# Patient Record
Sex: Male | Born: 1985 | Race: Black or African American | Hispanic: No | Marital: Single | Smoking: Current every day smoker
Health system: Southern US, Community
[De-identification: ages and names within clinical notes are randomized; demographics above are authoritative.]

## PROBLEM LIST (undated history)

## (undated) DIAGNOSIS — F1010 Alcohol abuse, uncomplicated: Secondary | ICD-10-CM

---

## 2008-08-27 ENCOUNTER — Emergency Department: Payer: Self-pay | Admitting: Emergency Medicine

## 2009-08-03 ENCOUNTER — Emergency Department: Payer: Self-pay | Admitting: Emergency Medicine

## 2010-07-17 ENCOUNTER — Emergency Department: Payer: Self-pay | Admitting: Emergency Medicine

## 2012-02-07 ENCOUNTER — Emergency Department: Payer: Self-pay | Admitting: *Deleted

## 2013-09-05 ENCOUNTER — Emergency Department: Payer: Self-pay | Admitting: Emergency Medicine

## 2013-09-05 LAB — COMPREHENSIVE METABOLIC PANEL
ANION GAP: 5 — AB (ref 7–16)
Albumin: 3.7 g/dL (ref 3.4–5.0)
Alkaline Phosphatase: 75 U/L
BILIRUBIN TOTAL: 0.2 mg/dL (ref 0.2–1.0)
BUN: 9 mg/dL (ref 7–18)
CHLORIDE: 107 mmol/L (ref 98–107)
Calcium, Total: 8.3 mg/dL — ABNORMAL LOW (ref 8.5–10.1)
Co2: 25 mmol/L (ref 21–32)
Creatinine: 0.98 mg/dL (ref 0.60–1.30)
Glucose: 92 mg/dL (ref 65–99)
Osmolality: 272 (ref 275–301)
POTASSIUM: 4 mmol/L (ref 3.5–5.1)
SGOT(AST): 28 U/L (ref 15–37)
SGPT (ALT): 40 U/L (ref 12–78)
Sodium: 137 mmol/L (ref 136–145)
Total Protein: 7.1 g/dL (ref 6.4–8.2)

## 2013-09-05 LAB — CBC
HCT: 42.9 % (ref 40.0–52.0)
HGB: 14.6 g/dL (ref 13.0–18.0)
MCH: 31.1 pg (ref 26.0–34.0)
MCHC: 34.1 g/dL (ref 32.0–36.0)
MCV: 91 fL (ref 80–100)
PLATELETS: 178 10*3/uL (ref 150–440)
RBC: 4.71 10*6/uL (ref 4.40–5.90)
RDW: 14 % (ref 11.5–14.5)
WBC: 6 10*3/uL (ref 3.8–10.6)

## 2013-09-05 LAB — ETHANOL
Ethanol %: 0.143 % — ABNORMAL HIGH (ref 0.000–0.080)
Ethanol: 143 mg/dL

## 2013-09-05 LAB — LIPASE, BLOOD: Lipase: 72 U/L — ABNORMAL LOW (ref 73–393)

## 2013-11-26 ENCOUNTER — Emergency Department: Payer: Self-pay | Admitting: Emergency Medicine

## 2013-11-26 LAB — URINALYSIS, COMPLETE
BILIRUBIN, UR: NEGATIVE
BLOOD: NEGATIVE
Bacteria: NONE SEEN
Glucose,UR: NEGATIVE mg/dL (ref 0–75)
Ketone: NEGATIVE
Leukocyte Esterase: NEGATIVE
Nitrite: NEGATIVE
PH: 6 (ref 4.5–8.0)
Protein: NEGATIVE
RBC,UR: 2 /HPF (ref 0–5)
SPECIFIC GRAVITY: 1.002 (ref 1.003–1.030)
Squamous Epithelial: NONE SEEN
WBC UR: <1 /HPF (ref 0–5)

## 2013-11-26 LAB — CBC WITH DIFFERENTIAL/PLATELET
BASOS ABS: 0.1 10*3/uL (ref 0.0–0.1)
Basophil %: 0.7 %
EOS ABS: 0.3 10*3/uL (ref 0.0–0.7)
EOS PCT: 3.6 %
HCT: 47.1 % (ref 40.0–52.0)
HGB: 15.9 g/dL (ref 13.0–18.0)
LYMPHS ABS: 2.4 10*3/uL (ref 1.0–3.6)
LYMPHS PCT: 30.1 %
MCH: 31.1 pg (ref 26.0–34.0)
MCHC: 33.8 g/dL (ref 32.0–36.0)
MCV: 92 fL (ref 80–100)
Monocyte #: 0.6 x10 3/mm (ref 0.2–1.0)
Monocyte %: 7 %
Neutrophil #: 4.7 10*3/uL (ref 1.4–6.5)
Neutrophil %: 58.6 %
PLATELETS: 254 10*3/uL (ref 150–440)
RBC: 5.1 10*6/uL (ref 4.40–5.90)
RDW: 13.8 % (ref 11.5–14.5)
WBC: 8.1 10*3/uL (ref 3.8–10.6)

## 2013-11-26 LAB — LIPASE, BLOOD: LIPASE: 69 U/L — AB (ref 73–393)

## 2013-11-26 LAB — COMPREHENSIVE METABOLIC PANEL
ALBUMIN: 4.5 g/dL (ref 3.4–5.0)
Alkaline Phosphatase: 73 U/L
Anion Gap: 5 — ABNORMAL LOW (ref 7–16)
BUN: 6 mg/dL — AB (ref 7–18)
Bilirubin,Total: 0.4 mg/dL (ref 0.2–1.0)
CREATININE: 1.1 mg/dL (ref 0.60–1.30)
Calcium, Total: 9.5 mg/dL (ref 8.5–10.1)
Chloride: 105 mmol/L (ref 98–107)
Co2: 29 mmol/L (ref 21–32)
GLUCOSE: 94 mg/dL (ref 65–99)
Osmolality: 275 (ref 275–301)
POTASSIUM: 3.6 mmol/L (ref 3.5–5.1)
SGOT(AST): 6 U/L — ABNORMAL LOW (ref 15–37)
SGPT (ALT): 30 U/L (ref 12–78)
SODIUM: 139 mmol/L (ref 136–145)
TOTAL PROTEIN: 8.2 g/dL (ref 6.4–8.2)

## 2013-12-31 ENCOUNTER — Emergency Department: Payer: Self-pay | Admitting: Emergency Medicine
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
## 2015-12-27 ENCOUNTER — Emergency Department
Admission: EM | Admit: 2015-12-27 | Discharge: 2015-12-28 | Disposition: A | Discharge status: Home / Self Care | Payer: Self-pay | Attending: Emergency Medicine | Admitting: Emergency Medicine

## 2015-12-27 DIAGNOSIS — F17210 Nicotine dependence, cigarettes, uncomplicated: Secondary | ICD-10-CM | POA: Insufficient documentation

## 2015-12-27 DIAGNOSIS — F10920 Alcohol use, unspecified with intoxication, uncomplicated: Secondary | ICD-10-CM

## 2015-12-27 DIAGNOSIS — F10129 Alcohol abuse with intoxication, unspecified: Secondary | ICD-10-CM | POA: Insufficient documentation

## 2015-12-27 MED ORDER — SODIUM CHLORIDE 0.9 % IV BOLUS (SEPSIS)
1000.0000 mL | Freq: Once | INTRAVENOUS | Status: AC
  Administered 2015-12-28: 1000 mL via INTRAVENOUS

## 2015-12-27 MED ORDER — ONDANSETRON HCL 4 MG/2ML IJ SOLN
4.0000 mg | Freq: Once | INTRAMUSCULAR | Status: AC
  Administered 2015-12-28: 4 mg via INTRAVENOUS
  Filled 2015-12-27: qty 2

## 2015-12-28 ENCOUNTER — Encounter: Payer: Self-pay | Admitting: Emergency Medicine

## 2015-12-28 LAB — URINE DRUG SCREEN, QUALITATIVE (ARMC ONLY)
Amphetamines, Ur Screen: NOT DETECTED
BARBITURATES, UR SCREEN: NOT DETECTED
BENZODIAZEPINE, UR SCRN: NOT DETECTED
Cannabinoid 50 Ng, Ur ~~LOC~~: NOT DETECTED
Cocaine Metabolite,Ur ~~LOC~~: NOT DETECTED
MDMA (Ecstasy)Ur Screen: NOT DETECTED
Methadone Scn, Ur: NOT DETECTED
OPIATE, UR SCREEN: NOT DETECTED
PHENCYCLIDINE (PCP) UR S: NOT DETECTED
Tricyclic, Ur Screen: NOT DETECTED

## 2015-12-28 LAB — ETHANOL: Alcohol, Ethyl (B): 349 mg/dL (ref <5)

## 2015-12-28 NOTE — ED Notes (Signed)
Pt found on hospital grounds laying on floor. Pt was initially dropped off by [place] police. Pt (+) etoh.

## 2015-12-28 NOTE — ED Notes (Signed)
Pt sleeping comfortably in stretcher with lights turned off. Respirations even and unlabored. Will continue to monitor.

## 2015-12-28 NOTE — Discharge Instructions (Signed)
Alcohol Intoxication  Alcohol intoxication occurs when the amount of alcohol that a person has consumed impairs his or her ability to mentally and physically function. Alcohol directly impairs the normal chemical activity of the brain. Drinking large amounts of alcohol can lead to changes in mental function and behavior, and it can cause many physical effects that can be harmful.   Alcohol intoxication can range in severity from mild to very severe. Various factors can affect the level of intoxication that occurs, such as the person's age, gender, weight, frequency of alcohol consumption, and the presence of other medical conditions (such as diabetes, seizures, or heart conditions). Dangerous levels of alcohol intoxication may occur when people drink large amounts of alcohol in a short period (binge drinking). Alcohol can also be especially dangerous when combined with certain prescription medicines or "recreational" drugs.  SIGNS AND SYMPTOMS  Some common signs and symptoms of mild alcohol intoxication include:  · Loss of coordination.  · Changes in mood and behavior.  · Impaired judgment.  · Slurred speech.  As alcohol intoxication progresses to more severe levels, other signs and symptoms will appear. These may include:  · Vomiting.  · Confusion and impaired memory.  · Slowed breathing.  · Seizures.  · Loss of consciousness.  DIAGNOSIS   Your health care provider will take a medical history and perform a physical exam. You will be asked about the amount and type of alcohol you have consumed. Blood tests will be done to measure the concentration of alcohol in your blood. In many places, your blood alcohol level must be lower than 80 mg/dL (0.08%) to legally drive. However, many dangerous effects of alcohol can occur at much lower levels.   TREATMENT   People with alcohol intoxication often do not require treatment. Most of the effects of alcohol intoxication are temporary, and they go away as the alcohol naturally  leaves the body. Your health care provider will monitor your condition until you are stable enough to go home. Fluids are sometimes given through an IV access tube to help prevent dehydration.   HOME CARE INSTRUCTIONS  · Do not drive after drinking alcohol.  · Stay hydrated. Drink enough water and fluids to keep your urine clear or pale yellow. Avoid caffeine.    · Only take over-the-counter or prescription medicines as directed by your health care provider.    SEEK MEDICAL CARE IF:   · You have persistent vomiting.    · You do not feel better after a few days.  · You have frequent alcohol intoxication. Your health care provider can help determine if you should see a substance use treatment counselor.  SEEK IMMEDIATE MEDICAL CARE IF:   · You become shaky or tremble when you try to stop drinking.    · You shake uncontrollably (seizure).    · You throw up (vomit) blood. This may be bright red or may look like black coffee grounds.    · You have blood in your stool. This may be bright red or may appear as a black, tarry, bad smelling stool.    · You become lightheaded or faint.    MAKE SURE YOU:   · Understand these instructions.  · Will watch your condition.  · Will get help right away if you are not doing well or get worse.     This information is not intended to replace advice given to you by your health care provider. Make sure you discuss any questions you have with your health care provider.       Document Released: 05/29/2005 Document Revised: 04/21/2013 Document Reviewed: 01/22/2013  Elsevier Interactive Patient Education ©2016 [employer]

## 2015-12-28 NOTE — ED Provider Notes (Signed)
[facility] Emergency Department Provider Note  ____________________________________________  Time seen: 12:40 AM  I have reviewed the triage vital signs and the nursing notes.  History Limited secondary to clinical intoxication HISTORY  Chief Complaint Alcohol Intoxication      HPI Joseph ProudReginald L Rinker Jr. is a 30 y.o. male patient found laying on hospital property [place] after [place]. Patient admits to EtOH intake today but denies any illicit drug use. Per report patient was taken to the emergency department from jail secondary to being "too intoxicated.   Past medical history None There are no active problems to display for this patient.   Past surgical history none No current outpatient prescriptions on file.  Allergies Review of patient's allergies indicates no known allergies.  No family history on file.  Social History Social History  Substance Use Topics  . Smoking status: Current Every Day Smoker -- 1.00 packs/day    Types: Cigarettes  . Smokeless tobacco: None  . Alcohol Use: Yes    Review of Systems  Constitutional: Negative for fever. Eyes: Negative for visual changes. ENT: Negative for sore throat. Cardiovascular: Negative for chest pain. Respiratory: Negative for shortness of breath. Gastrointestinal: Negative for abdominal pain, vomiting and diarrhea. Genitourinary: Negative for dysuria. Musculoskeletal: Negative for back pain. Skin: Negative for rash. Neurological: Negative for headaches, focal weakness or numbness. Psychiatric:Positive for alcohol intoxication  10-point ROS otherwise negative.  ____________________________________________   PHYSICAL EXAM:  VITAL SIGNS: ED Triage Vitals  Enc Vitals Group     BP 12/28/15 0002 127/89 mmHg     Pulse Rate 12/28/15 0002 61     Resp 12/28/15 0002 18     Temp 12/28/15 0002 97.9 F (36.6 C)     Temp Source 12/28/15 0002 Oral      SpO2 12/28/15 0002 100 %     Weight 12/28/15 0002 205 lb (92.987 kg)     Height 12/28/15 0002 6\' 4"  (1.93 m)     Head Cir --      Peak Flow --      Pain Score --      Pain Loc --      Pain Edu? --      Excl. in GC? --      Constitutional: Alert and oriented. Clinically intoxicated Eyes: Conjunctivae are normal. PERRL. Normal extraocular movements. ENT   Head: Normocephalic and atraumatic.   Nose: No congestion/rhinnorhea.   Mouth/Throat: Mucous membranes are moist.   Neck: No stridor. Hematological/Lymphatic/Immunilogical: No cervical lymphadenopathy. Cardiovascular: Normal rate, regular rhythm. Normal and symmetric distal pulses are present in all extremities. No murmurs, rubs, or gallops. Respiratory: Normal respiratory effort without tachypnea nor retractions. Breath sounds are clear and equal bilaterally. No wheezes/rales/rhonchi. Gastrointestinal: Soft and nontender. No distention. There is no CVA tenderness. Genitourinary: deferred Musculoskeletal: Nontender with normal range of motion in all extremities. No joint effusions.  No lower extremity tenderness nor edema. Neurologic:  Normal speech and language. No gross focal neurologic deficits are appreciated. Speech is normal.  Skin:  Skin is warm, dry and intact. No rash noted. Psychiatric: Mood and affect are normal. Speech and behavior are normal. Patient exhibits appropriate insight and judgment.  ____________________________________________    LABS (pertinent positives/negatives)  Labs Reviewed  ETHANOL - Abnormal; Notable for the following:    Alcohol, Ethyl (B) 349 (*)    All other components within normal limits  URINE DRUG SCREEN, QUALITATIVE ([facility] ONLY)  __   INITIAL IMPRESSION / ASSESSMENT AND PLAN / ED COURSE  Pertinent labs & imaging results that were available during my care of the patient were reviewed by me and considered in my medical decision making (see chart for  details).    ____________________________________________   FINAL CLINICAL IMPRESSION(S) / ED DIAGNOSES  Final diagnoses:  Alcohol intoxication, uncomplicated (HCC)      Darci Current, MD 12/28/15 [phone]

## 2015-12-28 NOTE — ED Notes (Signed)
This RN and Sarah, EDT assisted pt to bathroom. Pt voided in bed, linens and pt gown changed. Assisted pt back into bed. Will continue to monitor. .Marland Kitchen

## 2015-12-28 NOTE — ED Notes (Signed)

## 2016-02-24 ENCOUNTER — Encounter: Payer: Self-pay | Admitting: *Deleted

## 2016-02-24 ENCOUNTER — Emergency Department
Admission: EM | Admit: 2016-02-24 | Discharge: 2016-02-25 | Disposition: A | Discharge status: Home / Self Care | Payer: Self-pay | Attending: Emergency Medicine | Admitting: Emergency Medicine

## 2016-02-24 DIAGNOSIS — F10129 Alcohol abuse with intoxication, unspecified: Secondary | ICD-10-CM | POA: Insufficient documentation

## 2016-02-24 DIAGNOSIS — F17210 Nicotine dependence, cigarettes, uncomplicated: Secondary | ICD-10-CM | POA: Insufficient documentation

## 2016-02-24 DIAGNOSIS — R45851 Suicidal ideations: Secondary | ICD-10-CM

## 2016-02-24 DIAGNOSIS — F10920 Alcohol use, unspecified with intoxication, uncomplicated: Secondary | ICD-10-CM

## 2016-02-24 HISTORY — DX: Alcohol abuse, uncomplicated: F10.10

## 2016-02-24 LAB — CBC
HEMATOCRIT: 43.5 % (ref 40.0–52.0)
HEMOGLOBIN: 14.9 g/dL (ref 13.0–18.0)
MCH: 31.8 pg (ref 26.0–34.0)
MCHC: 34.4 g/dL (ref 32.0–36.0)
MCV: 92.6 fL (ref 80.0–100.0)
Platelets: 260 10*3/uL (ref 150–440)
RBC: 4.7 MIL/uL (ref 4.40–5.90)
RDW: 14.2 % (ref 11.5–14.5)
WBC: 5.9 10*3/uL (ref 3.8–10.6)

## 2016-02-24 LAB — ACETAMINOPHEN LEVEL

## 2016-02-24 LAB — COMPREHENSIVE METABOLIC PANEL
ALBUMIN: 4.4 g/dL (ref 3.5–5.0)
ALK PHOS: 48 U/L (ref 38–126)
ALT: 26 U/L (ref 17–63)
ANION GAP: 8 (ref 5–15)
AST: 15 U/L (ref 15–41)
BUN: 12 mg/dL (ref 6–20)
CALCIUM: 9 mg/dL (ref 8.9–10.3)
CO2: 24 mmol/L (ref 22–32)
Chloride: 111 mmol/L (ref 101–111)
Creatinine, Ser: 1.2 mg/dL (ref 0.61–1.24)
GFR calc Af Amer: >60 mL/min (ref >60)
GFR calc non Af Amer: >60 mL/min (ref >60)
GLUCOSE: 90 mg/dL (ref 65–99)
Potassium: 3.8 mmol/L (ref 3.5–5.1)
SODIUM: 143 mmol/L (ref 135–145)
Total Bilirubin: 0.4 mg/dL (ref 0.3–1.2)
Total Protein: 7.3 g/dL (ref 6.5–8.1)

## 2016-02-24 LAB — ETHANOL: Alcohol, Ethyl (B): 269 mg/dL — ABNORMAL HIGH (ref <5)

## 2016-02-24 LAB — SALICYLATE LEVEL: Salicylate Lvl: <4 mg/dL (ref 2.8–30.0)

## 2016-02-24 NOTE — ED Notes (Signed)
Per [place] [employer], IVC papers were taken out by the patient's mother for suicidal ideation and alcoholism. Patient denies SI/HI, states he was asleep when the [place] arrived. Patient states he drank alcohol this morning and has been in rehab for alcoholism. Patient state he wanted to flowers on the grave of his cousin who committed suicide and that worried his mother.

## 2016-02-24 NOTE — ED Notes (Signed)
Patient having SOC. 

## 2016-02-24 NOTE — ED Provider Notes (Addendum)
[facility] Emergency Department Provider Note  ____________________________________________  Time seen: Approximately 6 PM  I have reviewed the triage vital signs and the nursing notes.   HISTORY  Chief Complaint Suicidal    HPI Joseph ProudReginald L Sweaney Jr. is a 30 y.o. male history of alcohol abuse is presenting to the emergency department after being involuntarily committed by his mother.Patient says that he was asleep and has not threatened to kill himself. He denies any suicidal or homicidal ideation. He denies any self-harm attempts. Denies any toxic ingestions. Says that he only drinks on the weekends, every other weekend. Denies drinking during the week. Says his last drink was last night. Says has never gone through withdrawal.   Past Medical History  Diagnosis Date  . Alcohol abuse     There are no active problems to display for this patient.   History reviewed. No pertinent past surgical history.  No current outpatient prescriptions on file.  Allergies Review of patient's allergies indicates no known allergies.  No family history on file.  Social History Social History  Substance Use Topics  . Smoking status: Current Every Day Smoker -- 1.00 packs/day    Types: Cigarettes  . Smokeless tobacco: None  . Alcohol Use: Yes    Review of Systems Constitutional: No fever/chills Eyes: No visual changes. ENT: No sore throat. Cardiovascular: Denies chest pain. Respiratory: Denies shortness of breath. Gastrointestinal: No abdominal pain.  No nausea, no vomiting.  No diarrhea.  No constipation. Genitourinary: Negative for dysuria. Musculoskeletal: Negative for back pain. Skin: Negative for rash. Neurological: Negative for headaches, focal weakness or numbness.  10-point ROS otherwise negative.  ____________________________________________   PHYSICAL EXAM:  VITAL SIGNS: ED Triage Vitals  Enc Vitals Group     BP 02/24/16 1713 135/78  mmHg     Pulse Rate 02/24/16 1713 90     Resp 02/24/16 1713 16     Temp 02/24/16 1713 98.5 F (36.9 C)     Temp Source 02/24/16 1713 Oral     SpO2 02/24/16 1713 98 %     Weight 02/24/16 1718 188 lb (85.276 kg)     Height 02/24/16 1713 6\' 4"  (1.93 m)     Head Cir --      Peak Flow --      Pain Score --      Pain Loc --      Pain Edu? --      Excl. in GC? --     Constitutional: Alert and oriented. Well appearing and in no acute distress. Eyes: Conjunctivae are normal. PERRL. EOMI. Head: Atraumatic. Nose: No congestion/rhinnorhea. Mouth/Throat: Mucous membranes are moist.   Neck: No stridor.   Cardiovascular: Normal rate, regular rhythm. Grossly normal heart sounds.   Respiratory: Normal respiratory effort.  No retractions. Lungs CTAB. Gastrointestinal: Soft and nontender. No distention.  Musculoskeletal: No lower extremity tenderness nor edema.  No joint effusions. Neurologic:  Normal speech and language. No gross focal neurologic deficits are appreciated. No gait instability. Skin:  Skin is warm, dry and intact. No rash noted. Psychiatric: Mood and affect are normal. Speech and behavior are normal.  ____________________________________________   LABS (all labs ordered are listed, but only abnormal results are displayed)  Labs Reviewed  ETHANOL - Abnormal; Notable for the following:    Alcohol, Ethyl (B) 269 (*)    All other components within normal limits  ACETAMINOPHEN LEVEL - Abnormal; Notable for the following:    Acetaminophen (Tylenol), Serum <10 (*)  All other components within normal limits  COMPREHENSIVE METABOLIC PANEL  SALICYLATE LEVEL  CBC  URINE DRUG SCREEN, QUALITATIVE ([facility] ONLY)   ____________________________________________  EKG   ____________________________________________  RADIOLOGY   ____________________________________________   PROCEDURES   ____________________________________________   INITIAL IMPRESSION / ASSESSMENT AND PLAN  / ED COURSE  Pertinent labs & imaging results that were available during my care of the patient were reviewed by me and considered in my medical decision making (see chart for details).  Patient does not appear clinically intoxicated but does have an elevated alcohol level to the 200s. He downplays his drinking but in multiple places in his medical record he has been in the emergency Department for intoxication. We will uphold the involuntary commitment. We'll have him evaluated by the specialist on-call psychiatrist. ____________________________________________   FINAL CLINICAL IMPRESSION(S) / ED DIAGNOSES  Alcohol intoxication. Suicidal ideation.    NEW MEDICATIONS STARTED DURING THIS VISIT:  New Prescriptions   No medications on file     Note:  This document was prepared using Dragon voice recognition software and may include unintentional dictation errors.    Myrna Blazeravid Matthew Schaevitz, MD 02/24/16 1820  Awaiting specialist on-call formal consult. I gave the patient's mother's number for further information to the specialist on-call.  I had asked the patient's permission prior to speaking with his mother and he said to go ahead and speak with her about the details of his visit and medical history.  Patient's mother's contact information is Narda Rutherfordquanetta Fouty, [phone].    Myrna Blazeravid Matthew Schaevitz, MD 02/25/16 0002  Attempted to call Pt's mother for update, but number is busy.    Myrna Blazeravid Matthew Schaevitz, MD 02/25/16 [phone]

## 2016-02-25 ENCOUNTER — Telehealth: Payer: Self-pay | Admitting: Emergency Medicine

## 2016-02-25 NOTE — ED Notes (Signed)
Pt cooperative and pleasant. Pt denies SI/HI and AVH. When asked why he was in the hospital pt stated.  "I have no idea. I guess my mom thought I was suicidal."   No distress noted. Maintained on 15 minute checks and observation by security camera for safety.

## 2016-02-25 NOTE — BH Assessment (Signed)
Per request of ER MD Huel Cote([place]), writer provided the pt. with information and instructions on how to access Outpatient Mental Health & Substance Abuse Treatment ([facility] and [facility]).  Patient denies SI/HI and AV/H.  Writer spoke with patient about following up with [facility] and provided him with contact information for Peer Support Lorella Nimrod([place] [phone]), to help bridge the gap, until he receives Outpatient Mental Health/Substance Abuse Treatment.  With the permission of the patient, Clinical research associatewriter forwarded his contact information to Peer Support, in order that he may contact him

## 2016-02-25 NOTE — ED Notes (Signed)
Pt discharged and requesting work note for the days he was being treated in the ED

## 2016-02-25 NOTE — Discharge Instructions (Signed)
Alcohol Use Disorder °Alcohol use disorder is a mental disorder. It is not a one-time incident of heavy drinking. Alcohol use disorder is the excessive and uncontrollable use of alcohol over time that leads to problems with functioning in one or more areas of daily living. People with this disorder risk harming themselves and others when they drink to excess. Alcohol use disorder also can cause other mental disorders, such as mood and anxiety disorders, and serious physical problems. People with alcohol use disorder often misuse other drugs.  °Alcohol use disorder is common and widespread. Some people with this disorder drink alcohol to cope with or escape from negative life events. Others drink to relieve chronic pain or symptoms of mental illness. People with a family history of alcohol use disorder are at higher risk of losing control and using alcohol to excess.  °Drinking too much alcohol can cause injury, accidents, and health problems. One drink can be too much when you are: °· Working. °· Pregnant or breastfeeding. °· Taking medicines. Ask your doctor. °· Driving or planning to drive. °SYMPTOMS  °Signs and symptoms of alcohol use disorder may include the following:  °· Consumption of alcohol in larger amounts or over a longer period of time than intended. °· Multiple unsuccessful attempts to cut down or control alcohol use.   °· A great deal of time spent obtaining alcohol, using alcohol, or recovering from the effects of alcohol (hangover). °· A strong desire or urge to use alcohol (cravings).   °· Continued use of alcohol despite problems at work, school, or home because of alcohol use.   °· Continued use of alcohol despite problems in relationships because of alcohol use. °· Continued use of alcohol in situations when it is physically hazardous, such as driving a car. °· Continued use of alcohol despite awareness of a physical or psychological problem that is likely related to alcohol use. Physical  problems related to alcohol use can involve the brain, heart, liver, stomach, and intestines. Psychological problems related to alcohol use include intoxication, depression, anxiety, psychosis, delirium, and dementia.   °· The need for increased amounts of alcohol to achieve the same desired effect, or a decreased effect from the consumption of the same amount of alcohol (tolerance). °· Withdrawal symptoms upon reducing or stopping alcohol use, or alcohol use to reduce or avoid withdrawal symptoms. Withdrawal symptoms include: °¨ Racing heart. °¨ Hand tremor. °¨ Difficulty sleeping. °¨ Nausea. °¨ Vomiting. °¨ Hallucinations. °¨ Restlessness. °¨ Seizures. °DIAGNOSIS °Alcohol use disorder is diagnosed through an assessment by your health care provider. Your health care provider may start by asking three or four questions to screen for excessive or problematic alcohol use. To confirm a diagnosis of alcohol use disorder, at least two symptoms must be present within a 12-month period. The severity of alcohol use disorder depends on the number of symptoms: °· Mild--two or three. °· Moderate--four or five. °· Severe--six or more. °Your health care provider may perform a physical exam or use results from lab tests to see if you have physical problems resulting from alcohol use. Your health care provider may refer you to a mental health professional for evaluation. °TREATMENT  °Some people with alcohol use disorder are able to reduce their alcohol use to low-risk levels. Some people with alcohol use disorder need to quit drinking alcohol. When necessary, mental health professionals with specialized training in substance use treatment can help. Your health care provider can help you decide how severe your alcohol use disorder is and what type of treatment you need.   The following forms of treatment are available:   Detoxification. Detoxification involves the use of prescription medicines to prevent alcohol withdrawal  symptoms in the first week after quitting. This is important for people with a history of symptoms of withdrawal and for heavy drinkers who are likely to have withdrawal symptoms. Alcohol withdrawal can be dangerous and, in severe cases, cause death. Detoxification is usually provided in a hospital or in-patient substance use treatment facility.  Counseling or talk therapy. Talk therapy is provided by substance use treatment counselors. It addresses the reasons people use alcohol and ways to keep them from drinking again. The goals of talk therapy are to help people with alcohol use disorder find healthy activities and ways to cope with life stress, to identify and avoid triggers for alcohol use, and to handle cravings, which can cause relapse.  Medicines.Different medicines can help treat alcohol use disorder through the following actions:  Decrease alcohol cravings.  Decrease the positive reward response felt from alcohol use.  Produce an uncomfortable physical reaction when alcohol is used (aversion therapy).  Support groups. Support groups are run by people who have quit drinking. They provide emotional support, advice, and guidance. These forms of treatment are often combined. Some people with alcohol use disorder benefit from intensive combination treatment provided by specialized substance use treatment centers. Both inpatient and outpatient treatment programs are available.   This information is not intended to replace advice given to you by your health care provider. Make sure you discuss any questions you have with your health care provider.   Document Released: 09/26/2004 Document Revised: 09/09/2014 Document Reviewed: 11/26/2012 Elsevier Interactive Patient Education [employer]2016 Elsevier Inc.   Please return immediately if condition worsens. Please contact her primary physician or the physician you were given for referral. If you have any specialist physicians involved in her treatment  and plan please also contact them. Thank you for using [facility]. Please seek emergency department evaluation or immediate psychiatric help for suicidal thoughts, homicidal thoughts, or hallucinations.

## 2016-02-25 NOTE — ED Notes (Signed)
Patient's grandmother was in the lobby requesting to speak with psychiatrist. She was informed that any significant information would be passed on to TTS and MD. She wanted to visit with patient but it was after visiting hours and we were unable to make an exception secondary to unit acuity. Patient was given a message to call his grandmother to alleviate her anxiety. Maintained on 15 minute checks and observation by security camera for safety.

## 2016-02-25 NOTE — ED Provider Notes (Signed)
-----------------------------------------   2:13 PM on 02/25/2016 -----------------------------------------   Blood pressure 125/55, pulse 58, temperature 98 F (36.7 C), temperature source Oral, resp. rate 15, height 6\' 4"  (1.93 m), weight 188 lb (85.276 kg), SpO2 98 %.  Assuming care from Dr. Scotty CourtStafford.  In short, Joseph ProudReginald L Kyler Jr. is a 30 y.o. male with a chief complaint of Suicidal .  Refer to the original H&P for additional details.  The current plan of care is to discharge the patient home. Patient apparently was seen by psychiatry and felt to be stable but there was concern it did not reviewed his case with his mother who is a one that had him involuntarily committed. Contact is made with the mother and she stated she IVC the patient mainly due to concerns about alcohol abuse. Patient will be referred for outpatient evaluation at the local treatment center. Patient appears to be stable for discharge per TTS evaluation.Jennye Moccasin.   Joseph Elford S Vue Pavon, MD 02/25/16 [phone]

## 2016-02-25 NOTE — ED Notes (Signed)
Patient discharged ambulatory to home. He denies SI or HI. Discharge instructions reviewed with patient he verbalizes understanding. Patient received copy of DC plan and all personal belongings.

## 2016-02-25 NOTE — BH Assessment (Signed)
Assessment Note  Joseph ProudReginald L Olguin Jr. is an 30 y.o. male who presents to the ER, after his mother petitioned for him to under IVC.  According to the patient, he don't know why his mother petitioned him. He denies SI/HI and AV/H. He admits to using alcohol. He had had two DWI's and was court ordered to be inpatient [facility] because of the DWI's. He completed that 05/2015. Upon arrival to the ER, the patient's BAC was 269. He denies the use of any other mind alter substance. At the time of this interview, his UDS hasn't resulted.  Patient's cousin committed suicide approximately 4 years ago. They were closed. Patient didn't attend his funeral services because it too difficult for him. On yesterday, he made plans to visit his grave to put flowers on it. Patient believes, this what caused his mother to be alarmed.  This would have been the first time; he's been to the grave.  Writer received verbal permission to talk with the patient's mother. Patient provided the number. Per the parents [facility](Aquan[place] & Tiburcio BashReginald [phone]), they are concerned about the increase use of alcohol. [facility] he was doing well. After two months, he started back drinking. When he's sober, the patient is quiet an a introvert. When he drinks, he's loud, hyper-verbal and active. In the past, he became aggressive towards his father. This week he became verbally aggressive towards his mother. When asked about safety concerns, the expressed they were concerned he would harm his self when he intoxicated. He have no history of self harm or suicide attempts. Approximately 5 years ago, while intoxicated, he was walking down a busy street. He wasn't trying to walk in traffic to hurt his self. He was going to someone's house and it was the quickest way there.  Due to his alcohol use, patient has lost different jobs and negatively affect relationships. Due to  his use, he was asked to move out his parents' house.  During the interview, the patient was calm cooperative and pleasant. Throughout the assessment, he denied SI/HI and AV/H.  Diagnosis: Alcohol Abuse  Past Medical History:  Past Medical History  Diagnosis Date  . Alcohol abuse     History reviewed. No pertinent past surgical history.  Family History: No family history on file.  Social History:  reports that he has been smoking Cigarettes.  He has been smoking about 1.00 pack per day. He does not have any smokeless tobacco history on file. He reports that he drinks alcohol. His drug history is not on file.  Additional Social History:  Alcohol / Drug Use Pain Medications: See PTA Prescriptions: See PTA Over the Counter: See PTA History of alcohol / drug use?: Yes Longest period of sobriety (when/how long): 2 months Negative Consequences of Use:  (Reports of none) Withdrawal Symptoms:  (Reports of none) Substance #1 Name of Substance 1: Alcohol 1 - Age of First Use: 15 1 - Amount (size/oz): On average 2-40oz beer. 1 - Frequency: "Every other weekend when I'm off" 1 - Duration: "Recently started" Prior use was 6-40oz drinks 1 - Last Use / Amount: 02/24/2016  CIWA: CIWA-Ar BP: (!) 125/55 mmHg Pulse Rate: (!) 58 COWS:    Allergies: No Known Allergies  Home Medications:  (Not in a hospital admission)  OB/GYN Status:  No LMP for male patient.  General Assessment Data [facility] TTS Assessment: In system Is this a Tele or  Face-to-Face Assessment?: Face-to-Face Is this an Initial Assessment or a Re-assessment for this encounter?: Initial Assessment Marital status: Single Maiden name: n/a Is patient pregnant?: No Pregnancy Status: No Living Arrangements: Other (Comment) (Friend) Can pt return to current living arrangement?: Yes Admission Status: Involuntary Is patient capable of signing voluntary admission?: No Referral Source:  Self/Family/Friend Insurance type: None  Medical Screening Exam [facility]) Medical Exam completed: Yes  Crisis Care Plan Living Arrangements: Other (Comment) (Friend) Legal Guardian: Other: (None) Name of Psychiatrist: Reports of none Name of Therapist: Reports of none  Education Status Is patient currently in school?: No Current Grade: n/a Highest grade of school patient has completed: Some College 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 : No Suicidal Intent: No Has patient had any suicidal intent within the past 6 months prior to admission? : No Is patient at risk for suicide?: No Suicidal Plan?: No Has patient had any suicidal plan within the past 6 months prior to admission? : No Access to Means: No What has been your use of drugs/alcohol within the last 12 months?: Alcohol Previous Attempts/Gestures: No How many times?: 0 Other Self Harm Risks: Reports of none Triggers for Past Attempts: None known Intentional Self Injurious Behavior: None Family Suicide History: Yes (Cousin-4 years ago) Recent stressful life event(s):  (Reports of none) Persecutory voices/beliefs?: No Depression: No Depression Symptoms:  (Reports of none) Substance abuse history and/or treatment for substance abuse?: Yes Suicide prevention information given to non-admitted patients: Not applicable  Risk to Others within the past 6 months Homicidal Ideation: No Does patient have any lifetime risk of violence toward others beyond the six months prior to admission? : No Thoughts of Harm to Others: No Current Homicidal Intent: No Current Homicidal Plan: No Access to Homicidal Means: No Identified Victim: Reports of none History of harm to others?: No Assessment of Violence: None Noted Violent Behavior  Description: Reports of none Does patient have access to weapons?: No Criminal Charges Pending?: No Does patient have a court date: No Is patient on probation?: No  Psychosis Hallucinations: None noted Delusions: None noted  Mental Status Report Appearance/Hygiene: In hospital gown, In scrubs, Unremarkable Eye Contact: Fair Motor Activity: Freedom of movement, Unremarkable Speech: Logical/coherent, Unremarkable Level of Consciousness: Alert Mood: Pleasant Affect: Appropriate to circumstance Anxiety Level: None Thought Processes: Coherent, Relevant Judgement: Unimpaired Orientation: Person, Place, Time, Situation, Appropriate for developmental age Obsessive Compulsive Thoughts/Behaviors: None  Cognitive Functioning Concentration: Normal Memory: Recent Intact, Remote Intact IQ: Average Insight: Fair Impulse Control: Fair Appetite: Good Weight Loss: 0 Weight Gain: 0 Sleep: No Change Total Hours of Sleep: 7 Vegetative Symptoms: None  ADLScreening [facility] Assessment Services) Patient's cognitive ability adequate to safely complete daily activities?: Yes Patient able to express need for assistance with ADLs?: Yes Independently performs ADLs?: Yes (appropriate for developmental age)  Prior Inpatient Therapy Prior Inpatient Therapy: No Prior Therapy Dates: Reports of none Prior Therapy Facilty/Provider(s): Reports of none Reason for Treatment: Reports of none  Prior Outpatient Therapy Prior Outpatient Therapy: No Prior Therapy Dates: Reports of none Prior Therapy Facilty/Provider(s): Reports of none Reason for Treatment: Reports of none Does patient have an ACCT team?: No Does patient have Intensive In-House Services?  : No Does patient have [facility] services? : No Does patient have P4CC services?: No  ADL Screening (condition at time of admission) Patient's cognitive ability adequate to safely complete daily activities?: Yes Is the patient deaf or have difficulty  hearing?: No Does the patient have difficulty seeing, even when wearing glasses/contacts?: No Does the patient have difficulty concentrating, remembering, or making decisions?: No Patient able to express need for assistance with ADLs?: Yes Does the patient have difficulty dressing or bathing?: No Independently performs ADLs?: Yes (appropriate for developmental age) Does the patient have difficulty walking or climbing stairs?: No Weakness of Legs: None Weakness of Arms/Hands: None  Home Assistive Devices/Equipment Home Assistive Devices/Equipment: None  Therapy Consults (therapy consults require a physician order) PT Evaluation Needed: No OT Evalulation Needed: No SLP Evaluation Needed: No Abuse/Neglect Assessment (Assessment to be complete while patient is alone) Physical Abuse: Denies Verbal Abuse: Denies Sexual Abuse: Denies Exploitation of patient/patient's resources: Denies Self-Neglect: Denies Values / Beliefs Cultural Requests During Hospitalization: None Spiritual Requests During Hospitalization: None Consults Spiritual Care Consult Needed: No Social Work Consult Needed: No Merchant navy officerAdvance Directives (For Healthcare) Does patient have an advance directive?: No    Additional Information 1:1 In Past 12 Months?: No CIRT Risk: No Elopement Risk: No Does patient have medical clearance?: Yes  Child/Adolescent Assessment Running Away Risk: Denies (Patient is an adult)  Disposition:  Disposition Initial Assessment Completed for this Encounter: Yes Disposition of Patient: Other dispositions (ER MD ordered Psych Consult)  On Site Evaluation by:   Reviewed with Physician:    Lilyan Gilfordalvin J. Lew Prout MS, LCAS, LPC, NCC, CCSI Therapeutic Triage Specialist 02/25/2016 1:07 PM

## 2016-02-25 NOTE — ED Notes (Signed)
Patient resting quietly in room. No noted distress or abnormal behaviors noted. Will continue 15 minute checks and observation by security camera for safety. 

## 2016-02-25 NOTE — ED Notes (Signed)
TTS at bedside. Pt is calm. No distress noted. Maintained on 15 minute checks and observation by security camera for safety.

## 2023-10-01 DIAGNOSIS — M545 Low back pain, unspecified: Secondary | ICD-10-CM | POA: Diagnosis not present

## 2023-10-01 DIAGNOSIS — F17210 Nicotine dependence, cigarettes, uncomplicated: Secondary | ICD-10-CM | POA: Diagnosis not present
